# Patient Record
Sex: Male | Born: 1960 | Race: White | Hispanic: No | Marital: Married | Smoking: Never smoker
Health system: Southern US, Community
[De-identification: ages and names within clinical notes are randomized; demographics above are authoritative.]

## PROBLEM LIST (undated history)

## (undated) DIAGNOSIS — L570 Actinic keratosis: Secondary | ICD-10-CM

## (undated) HISTORY — PX: SKIN GRAFT: SHX250

## (undated) HISTORY — PX: NECK SURGERY: SHX720

## (undated) HISTORY — DX: Actinic keratosis: L57.0

---

## 2012-01-14 ENCOUNTER — Ambulatory Visit: Payer: Self-pay | Admitting: Unknown Physician Specialty

## 2013-06-23 ENCOUNTER — Ambulatory Visit: Payer: Self-pay | Admitting: Unknown Physician Specialty

## 2014-06-09 ENCOUNTER — Emergency Department: Payer: Self-pay | Admitting: Emergency Medicine

## 2015-04-03 ENCOUNTER — Emergency Department
Admission: EM | Admit: 2015-04-03 | Discharge: 2015-04-03 | Disposition: A | Discharge status: Home / Self Care | Payer: PRIVATE HEALTH INSURANCE | Attending: Emergency Medicine | Admitting: Emergency Medicine

## 2015-04-03 ENCOUNTER — Encounter: Payer: Self-pay | Admitting: Emergency Medicine

## 2015-04-03 ENCOUNTER — Emergency Department: Payer: PRIVATE HEALTH INSURANCE

## 2015-04-03 DIAGNOSIS — Y92410 Unspecified street and highway as the place of occurrence of the external cause: Secondary | ICD-10-CM | POA: Insufficient documentation

## 2015-04-03 DIAGNOSIS — Y998 Other external cause status: Secondary | ICD-10-CM | POA: Diagnosis not present

## 2015-04-03 DIAGNOSIS — Y9389 Activity, other specified: Secondary | ICD-10-CM | POA: Diagnosis not present

## 2015-04-03 DIAGNOSIS — S161XXA Strain of muscle, fascia and tendon at neck level, initial encounter: Secondary | ICD-10-CM | POA: Diagnosis not present

## 2015-04-03 DIAGNOSIS — S199XXA Unspecified injury of neck, initial encounter: Secondary | ICD-10-CM | POA: Diagnosis present

## 2015-04-03 MED ORDER — DIAZEPAM 2 MG PO TABS: 2.0000 mg | ORAL_TABLET | Freq: Three times a day (TID) | ORAL | Status: AC | PRN

## 2015-04-03 MED ORDER — HYDROCODONE-ACETAMINOPHEN 5-325 MG PO TABS: 1.0000 | ORAL_TABLET | ORAL | Status: AC | PRN

## 2015-04-03 MED ORDER — NAPROXEN 500 MG PO TABS: 500.0000 mg | ORAL_TABLET | Freq: Two times a day (BID) | ORAL | Status: AC
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
## 2015-04-03 NOTE — Discharge Instructions (Signed)
Cervical Sprain A cervical sprain is when the tissues (ligaments) that hold the neck bones in place stretch or tear. HOME CARE   Put ice on the injured area.  Put ice in a plastic bag.  Place a towel between your skin and the bag.  Leave the ice on for 15-20 minutes, 3-4 times a day.  You may have been given a collar to wear. This collar keeps your neck from moving while you heal.  Do not take the collar off unless told by your doctor.  If you have long hair, keep it outside of the collar.  Ask your doctor before changing the position of your collar. You may need to change its position over time to make it more comfortable.  If you are allowed to take off the collar for cleaning or bathing, follow your doctor's instructions on how to do it safely.  Keep your collar clean by wiping it with mild soap and water. Dry it completely. If the collar has removable pads, remove them every 1-2 days to hand wash them with soap and water. Allow them to air dry. They should be dry before you wear them in the collar.  Do not drive while wearing the collar.  Only take medicine as told by your doctor.  Keep all doctor visits as told.  Keep all physical therapy visits as told.  Adjust your work station so that you have good posture while you work.  Avoid positions and activities that make your problems worse.  Warm up and stretch before being active. GET HELP IF:  Your pain is not controlled with medicine.  You cannot take less pain medicine over time as planned.  Your activity level does not improve as expected. GET HELP RIGHT AWAY IF:   You are bleeding.  Your stomach is upset.  You have an allergic reaction to your medicine.  You develop new problems that you cannot explain.  You lose feeling (become numb) or you cannot move any part of your body (paralysis).  You have tingling or weakness in any part of your body.  Your symptoms get worse. Symptoms include:  Pain,  soreness, stiffness, puffiness (swelling), or a burning feeling in your neck.  Pain when your neck is touched.  Shoulder or upper back pain.  Limited ability to move your neck.  Headache.  Dizziness.  Your hands or arms feel week, lose feeling, or tingle.  Muscle spasms.  Difficulty swallowing or chewing. MAKE SURE YOU:   Understand these instructions.  Will watch your condition.  Will get help right away if you are not doing well or get worse.   This information is not intended to replace advice given to you by your health care provider. Make sure you discuss any questions you have with your health care provider.   Document Released: 10/07/2007 Document Revised: 12/21/2012 Document Reviewed: 10/26/2012 Elsevier Interactive Patient Education 2016 [place] with your doctor if any continued problems with your neck. Moist heat or ice to muscle areas as needed for comfort. Naproxen 500 mg twice a day with food as needed for inflammation, diazepam 2 mg every 8 hours as needed for muscle spasms, and Norco as needed for severe pain if needed. Return to the emergency room if any severe worsening of her symptoms.
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
## 2017-11-26 ENCOUNTER — Other Ambulatory Visit
Admission: RE | Admit: 2017-11-26 | Discharge: 2017-11-26 | Disposition: A | Discharge status: Home / Self Care | Payer: 59 | Source: Ambulatory Visit | Attending: Family | Admitting: Family

## 2017-11-26 DIAGNOSIS — E0500 Thyrotoxicosis with diffuse goiter without thyrotoxic crisis or storm: Secondary | ICD-10-CM | POA: Diagnosis not present

## 2017-11-26 LAB — HEPATIC FUNCTION PANEL
ALK PHOS: 77 U/L (ref 38–126)
ALT: 93 U/L — ABNORMAL HIGH (ref 0–44)
AST: 42 U/L — ABNORMAL HIGH (ref 15–41)
Albumin: 4.1 g/dL (ref 3.5–5.0)
BILIRUBIN INDIRECT: 1.2 mg/dL — AB (ref 0.3–0.9)
BILIRUBIN TOTAL: 1.3 mg/dL — AB (ref 0.3–1.2)
Bilirubin, Direct: 0.1 mg/dL (ref 0.0–0.2)
Total Protein: 7 g/dL (ref 6.5–8.1)

## 2017-11-26 LAB — CBC
HEMATOCRIT: 44 % (ref 40.0–52.0)
HEMOGLOBIN: 15.4 g/dL (ref 13.0–18.0)
MCH: 30.9 pg (ref 26.0–34.0)
MCHC: 35.1 g/dL (ref 32.0–36.0)
MCV: 88.2 fL (ref 80.0–100.0)
Platelets: 226 10*3/uL (ref 150–440)
RBC: 4.99 MIL/uL (ref 4.40–5.90)
RDW: 13.9 % (ref 11.5–14.5)
WBC: 5 10*3/uL (ref 3.8–10.6)

## 2017-11-26 LAB — T4, FREE: Free T4: 1.02 ng/dL (ref 0.82–1.77)

## 2017-11-28 LAB — THYROID PANEL WITH TSH
FREE THYROXINE INDEX: 2.3 (ref 1.2–4.9)
T3 UPTAKE RATIO: 29 % (ref 24–39)
T4, Total: 7.8 ug/dL (ref 4.5–12.0)
TSH: <0.006 u[IU]/mL — ABNORMAL LOW (ref 0.450–4.500)

## 2017-11-28 LAB — HEPATITIS PANEL, ACUTE
HEP B C IGM: NEGATIVE
Hep A IgM: NEGATIVE
Hepatitis B Surface Ag: NEGATIVE

## 2017-11-28 LAB — T3, FREE: T3, Free: 3.6 pg/mL (ref 2.0–4.4)

## 2018-11-14 ENCOUNTER — Other Ambulatory Visit: Admission: RE | Admit: 2018-11-14 | Payer: 59 | Source: Ambulatory Visit

## 2018-11-17 ENCOUNTER — Ambulatory Visit: Admit: 2018-11-17 | Payer: 59 | Admitting: Podiatry

## 2018-11-17 SURGERY — BONE EXCISION
Anesthesia: Choice | Laterality: Left

## 2019-03-20 ENCOUNTER — Other Ambulatory Visit
Admission: RE | Admit: 2019-03-20 | Discharge: 2019-03-20 | Disposition: A | Discharge status: Home / Self Care | Payer: 59 | Source: Ambulatory Visit | Attending: Family | Admitting: Family

## 2019-03-20 DIAGNOSIS — E0500 Thyrotoxicosis with diffuse goiter without thyrotoxic crisis or storm: Secondary | ICD-10-CM | POA: Diagnosis not present

## 2019-03-20 LAB — CBC
HCT: 40.6 % (ref 39.0–52.0)
Hemoglobin: 14.2 g/dL (ref 13.0–17.0)
MCH: 31.6 pg (ref 26.0–34.0)
MCHC: 35 g/dL (ref 30.0–36.0)
MCV: 90.2 fL (ref 80.0–100.0)
Platelets: 216 10*3/uL (ref 150–400)
RBC: 4.5 MIL/uL (ref 4.22–5.81)
RDW: 12.4 % (ref 11.5–15.5)
WBC: 5.5 10*3/uL (ref 4.0–10.5)
nRBC: 0 % (ref 0.0–0.2)

## 2019-03-20 LAB — HEPATIC FUNCTION PANEL
ALT: 28 U/L (ref 0–44)
AST: 31 U/L (ref 15–41)
Albumin: 4.5 g/dL (ref 3.5–5.0)
Alkaline Phosphatase: 51 U/L (ref 38–126)
Bilirubin, Direct: 0.2 mg/dL (ref 0.0–0.2)
Indirect Bilirubin: 1.3 mg/dL — ABNORMAL HIGH (ref 0.3–0.9)
Total Bilirubin: 1.5 mg/dL — ABNORMAL HIGH (ref 0.3–1.2)
Total Protein: 7.4 g/dL (ref 6.5–8.1)

## 2019-03-20 LAB — TSH: TSH: 3.966 u[IU]/mL (ref 0.350–4.500)

## 2019-03-20 LAB — T4, FREE: Free T4: 0.78 ng/dL (ref 0.61–1.12)

## 2019-03-21 LAB — THYROTROPIN RECEPTOR AUTOABS: Thyrotropin Receptor Ab: <1.1 IU/L (ref 0.00–1.75)

## 2019-03-21 LAB — T3: T3, Total: 86 ng/dL (ref 71–180)

## 2021-12-08 ENCOUNTER — Ambulatory Visit: Payer: BC Managed Care – PPO | Admitting: Dermatology

## 2021-12-08 DIAGNOSIS — L814 Other melanin hyperpigmentation: Secondary | ICD-10-CM

## 2021-12-08 DIAGNOSIS — L821 Other seborrheic keratosis: Secondary | ICD-10-CM

## 2021-12-08 DIAGNOSIS — L578 Other skin changes due to chronic exposure to nonionizing radiation: Secondary | ICD-10-CM

## 2021-12-08 DIAGNOSIS — L738 Other specified follicular disorders: Secondary | ICD-10-CM | POA: Diagnosis not present

## 2021-12-08 DIAGNOSIS — Z1283 Encounter for screening for malignant neoplasm of skin: Secondary | ICD-10-CM | POA: Diagnosis not present

## 2021-12-08 DIAGNOSIS — D229 Melanocytic nevi, unspecified: Secondary | ICD-10-CM | POA: Diagnosis not present

## 2021-12-08 DIAGNOSIS — I8393 Asymptomatic varicose veins of bilateral lower extremities: Secondary | ICD-10-CM

## 2021-12-08 DIAGNOSIS — I781 Nevus, non-neoplastic: Secondary | ICD-10-CM

## 2021-12-08 DIAGNOSIS — D1800 Hemangioma unspecified site: Secondary | ICD-10-CM

## 2021-12-08 DIAGNOSIS — L570 Actinic keratosis: Secondary | ICD-10-CM | POA: Diagnosis not present

## 2021-12-08 DIAGNOSIS — L820 Inflamed seborrheic keratosis: Secondary | ICD-10-CM

## 2021-12-08 NOTE — Progress Notes (Unsigned)
New Patient Visit  Subjective  Marcus Randolph is a 61 y.o. male who presents for the following: Annual Exam (No history of skin cancer or abnormal moles - The patient presents for Total-Body Skin Exam (TBSE) for skin cancer screening and mole check.  The patient has spots, moles and lesions to be evaluated, some may be new or changing and the patient has concerns that these could be cancer./).  The following portions of the chart were reviewed this encounter and updated as appropriate:   Tobacco  Allergies  Meds  Problems  Med Hx  Surg Hx  Fam Hx     Review of Systems:  No other skin or systemic complaints except as noted in HPI or Assessment and Plan.  Objective  Well appearing patient in no apparent distress; mood and affect are within normal limits.  A full examination was performed including scalp, head, eyes, ears, nose, lips, neck, chest, axillae, abdomen, back, buttocks, bilateral upper extremities, bilateral lower extremities, hands, feet, fingers, toes, fingernails, and toenails. All findings within normal limits unless otherwise noted below.  Face (3) Erythematous thin papules/macules with gritty scale.   Face Yellow papules  Face Dilated blood vessels  Scalp (5) Erythematous stuck-on, waxy papule or plaque  Legs Spider veins   Assessment & Plan   Lentigines - Scattered tan macules - Due to sun exposure - Benign-appearing, observe - Recommend daily broad spectrum sunscreen SPF 30+ to sun-exposed areas, reapply every 2 hours as needed. - Call for any changes  Seborrheic Keratoses - Stuck-on, waxy, tan-brown papules and/or plaques  - Benign-appearing - Discussed benign etiology and prognosis. - Observe - Call for any changes  Melanocytic Nevi - Tan-brown and/or pink-flesh-colored symmetric macules and papules - Benign appearing on exam today - Observation - Call clinic for new or changing moles - Recommend daily use of broad spectrum spf 30+  sunscreen to sun-exposed areas.   Hemangiomas - Red papules - Discussed benign nature - Observe - Call for any changes  Actinic Damage - Chronic condition, secondary to cumulative UV/sun exposure - diffuse scaly erythematous macules with underlying dyspigmentation - Recommend daily broad spectrum sunscreen SPF 30+ to sun-exposed areas, reapply every 2 hours as needed.  - Staying in the shade or wearing long sleeves, sun glasses (UVA+UVB protection) and wide brim hats (4-inch brim around the entire circumference of the hat) are also recommended for sun protection.  - Call for new or changing lesions.  Skin cancer screening performed today.  AK (actinic keratosis) (3) Face Destruction of lesion - Face Complexity: simple   Destruction method: cryotherapy   Informed consent: discussed and consent obtained   Timeout:  patient name, date of birth, surgical site, and procedure verified Lesion destroyed using liquid nitrogen: Yes   Region frozen until ice ball extended beyond lesion: Yes   Outcome: patient tolerated procedure well with no complications   Post-procedure details: wound care instructions given    Sebaceous hyperplasia Face Benign, observe.   Telangiectasia Face Benign, observe.  Discussed the treatment option of BBL/laser.  Typically we recommend 1-3 treatment sessions about 5-8 weeks apart for best results.  The patient's condition may require "maintenance treatments" in the future.  The fee for BBL / laser treatments is $350 per treatment session for the whole face.  A fee can be quoted for other parts of the body. Insurance typically does not pay for BBL/laser treatments and therefore the fee is an out-of-pocket cost.  Inflamed seborrheic keratosis (5) Scalp  Destruction of lesion - Scalp Complexity: simple   Destruction method: cryotherapy   Informed consent: discussed and consent obtained   Timeout:  patient name, date of birth, surgical site, and procedure  verified Lesion destroyed using liquid nitrogen: Yes   Region frozen until ice ball extended beyond lesion: Yes   Outcome: patient tolerated procedure well with no complications   Post-procedure details: wound care instructions given    Return in about 1 year (around 12/09/2022) for TBSE.  I, Ashok Cordia, CMA, am acting as scribe for Sarina Ser, MD . Documentation: I have reviewed the above documentation for accuracy and completeness, and I agree with the above.  Sarina Ser, MD

## 2021-12-08 NOTE — Patient Instructions (Signed)
Cryotherapy Aftercare  Wash gently with soap and water everyday.   Apply Vaseline and Band-Aid daily until healed.     Due to recent changes in healthcare laws, you may see results of your pathology and/or laboratory studies on MyChart before the doctors have had a chance to review them. We understand that in some cases there may be results that are confusing or concerning to you. Please understand that not all results are received at the same time and often the doctors may need to interpret multiple results in order to provide you with the best plan of care or course of treatment. Therefore, we ask that you please give us 2 business days to thoroughly review all your results before contacting the office for clarification. Should we see a critical lab result, you will be contacted sooner.   If You Need Anything After Your Visit  If you have any questions or concerns for your doctor, please call our main line at [phone] and press option 4 to reach your doctor's medical assistant. If no one answers, please leave a voicemail as directed and we will return your call as soon as possible. Messages left after 4 pm will be answered the following business day.   You may also send us a message via MyChart. We typically respond to MyChart messages within 1-2 business days.  For prescription refills, please ask your pharmacy to contact our office. Our fax number is [phone].  If you have an urgent issue when the clinic is closed that cannot wait until the next business day, you can page your doctor at the number below.    Please note that while we do our best to be available for urgent issues outside of office hours, we are not available 24/7.   If you have an urgent issue and are unable to reach us, you may choose to seek medical care at your doctor's office, retail clinic, urgent care center, or emergency room.  If you have a medical emergency, please immediately call 911 or go to the  emergency department.  Pager Numbers  - Dr. Kowalski: [phone]  - Dr. Moye: [phone]  - Dr. Stewart: [phone]  In the event of inclement weather, please call our main line at [phone] for an update on the status of any delays or closures.  Dermatology Medication Tips: Please keep the boxes that topical medications come in in order to help keep track of the instructions about where and how to use these. Pharmacies typically print the medication instructions only on the boxes and not directly on the medication tubes.   If your medication is too expensive, please contact our office at [phone] option 4 or send us a message through MyChart.   We are unable to tell what your co-pay for medications will be in advance as this is different depending on your insurance coverage. However, we may be able to find a substitute medication at lower cost or fill out paperwork to get insurance to cover a needed medication.   If a prior authorization is required to get your medication covered by your insurance company, please allow us 1-2 business days to complete this process.  Drug prices often vary depending on where the prescription is filled and some pharmacies may offer cheaper prices.  The website www.goodrx.com contains coupons for medications through different pharmacies. The prices here do not account for what the cost may be with help from insurance (it may be cheaper with your insurance), but the website can   give you the price if you did not use any insurance.  - You can print the associated coupon and take it with your prescription to the pharmacy.  - You may also stop by our office during regular business hours and pick up a GoodRx coupon card.  - If you need your prescription sent electronically to a different pharmacy, notify our office through [facility] MyChart or by phone at [phone] option 4.     Si Usted Necesita Algo Despus de Su Visita  Tambin puede  enviarnos un mensaje a travs de MyChart. Por lo general respondemos a los mensajes de MyChart en el transcurso de 1 a 2 das hbiles.  Para renovar recetas, por favor pida a su farmacia que se ponga en contacto con nuestra oficina. Nuestro nmero de fax es el [phone].  Si tiene un asunto urgente cuando la clnica est cerrada y que no puede esperar hasta el siguiente da hbil, puede llamar/localizar a su doctor(a) al nmero que aparece a continuacin.   Por favor, tenga en cuenta que aunque hacemos todo lo posible para estar disponibles para asuntos urgentes fuera del horario de oficina, no estamos disponibles las 24 horas del da, los 7 das de la semana.   Si tiene un problema urgente y no puede comunicarse con nosotros, puede optar por buscar atencin mdica  en el consultorio de su doctor(a), en una clnica privada, en un centro de atencin urgente o en una sala de emergencias.  Si tiene una emergencia mdica, por favor llame inmediatamente al 911 o vaya a la sala de emergencias.  Nmeros de bper  - Dr. Kowalski: [phone]  - Dra. Moye: [phone]  - Dra. Stewart: [phone]  En caso de inclemencias del tiempo, por favor llame a nuestra lnea principal al [phone] para una actualizacin sobre el estado de cualquier retraso o cierre.  Consejos para la medicacin en dermatologa: Por favor, guarde las cajas en las que vienen los medicamentos de uso tpico para ayudarle a seguir las instrucciones sobre dnde y cmo usarlos. Las farmacias generalmente imprimen las instrucciones del medicamento slo en las cajas y no directamente en los tubos del medicamento.   Si su medicamento es muy caro, por favor, pngase en contacto con nuestra oficina llamando al [phone] y presione la opcin 4 o envenos un mensaje a travs de MyChart.   No podemos decirle cul ser su copago por los medicamentos por adelantado ya que esto es diferente dependiendo de la cobertura de su seguro.  Sin embargo, es posible que podamos encontrar un medicamento sustituto a menor costo o llenar un formulario para que el seguro cubra el medicamento que se considera necesario.   Si se requiere una autorizacin previa para que su compaa de seguros cubra su medicamento, por favor permtanos de 1 a 2 das hbiles para completar este proceso.  Los precios de los medicamentos varan con frecuencia dependiendo del lugar de dnde se surte la receta y alguna farmacias pueden ofrecer precios ms baratos.  El sitio web www.goodrx.com tiene cupones para medicamentos de diferentes farmacias. Los precios aqu no tienen en cuenta lo que podra costar con la ayuda del seguro (puede ser ms barato con su seguro), pero el sitio web puede darle el precio si no utiliz ningn seguro.  - Puede imprimir el cupn correspondiente y llevarlo con su receta a la farmacia.  - Tambin puede pasar por nuestra oficina durante el horario de atencin regular y recoger una tarjeta de cupones de GoodRx.  -   Si necesita que su receta se enve electrnicamente a una farmacia diferente, informe a nuestra oficina a travs de MyChart de [facility] o por telfono llamando al [phone] y presione la opcin 4.  

## 2021-12-09 ENCOUNTER — Encounter: Payer: Self-pay | Admitting: Dermatology

## 2023-01-13 ENCOUNTER — Ambulatory Visit: Payer: BC Managed Care – PPO | Admitting: Dermatology

## 2023-01-13 ENCOUNTER — Encounter: Payer: Self-pay | Admitting: Dermatology

## 2023-01-13 VITALS — BP 140/82

## 2023-01-13 DIAGNOSIS — Z1283 Encounter for screening for malignant neoplasm of skin: Secondary | ICD-10-CM | POA: Diagnosis not present

## 2023-01-13 DIAGNOSIS — L738 Other specified follicular disorders: Secondary | ICD-10-CM

## 2023-01-13 DIAGNOSIS — T148XXA Other injury of unspecified body region, initial encounter: Secondary | ICD-10-CM

## 2023-01-13 DIAGNOSIS — L814 Other melanin hyperpigmentation: Secondary | ICD-10-CM

## 2023-01-13 DIAGNOSIS — L570 Actinic keratosis: Secondary | ICD-10-CM | POA: Diagnosis not present

## 2023-01-13 DIAGNOSIS — Z7189 Other specified counseling: Secondary | ICD-10-CM

## 2023-01-13 DIAGNOSIS — L810 Postinflammatory hyperpigmentation: Secondary | ICD-10-CM

## 2023-01-13 DIAGNOSIS — S8011XA Contusion of right lower leg, initial encounter: Secondary | ICD-10-CM

## 2023-01-13 DIAGNOSIS — Z872 Personal history of diseases of the skin and subcutaneous tissue: Secondary | ICD-10-CM

## 2023-01-13 DIAGNOSIS — W908XXA Exposure to other nonionizing radiation, initial encounter: Secondary | ICD-10-CM

## 2023-01-13 DIAGNOSIS — Z5111 Encounter for antineoplastic chemotherapy: Secondary | ICD-10-CM

## 2023-01-13 DIAGNOSIS — D1801 Hemangioma of skin and subcutaneous tissue: Secondary | ICD-10-CM

## 2023-01-13 DIAGNOSIS — D229 Melanocytic nevi, unspecified: Secondary | ICD-10-CM

## 2023-01-13 DIAGNOSIS — L578 Other skin changes due to chronic exposure to nonionizing radiation: Secondary | ICD-10-CM | POA: Diagnosis not present

## 2023-01-13 DIAGNOSIS — D692 Other nonthrombocytopenic purpura: Secondary | ICD-10-CM

## 2023-01-13 DIAGNOSIS — Z79899 Other long term (current) drug therapy: Secondary | ICD-10-CM

## 2023-01-13 DIAGNOSIS — L821 Other seborrheic keratosis: Secondary | ICD-10-CM

## 2023-01-13 DIAGNOSIS — I781 Nevus, non-neoplastic: Secondary | ICD-10-CM

## 2023-01-13 MED ORDER — FLUOROURACIL 5 % EX CREA: TOPICAL_CREAM | Freq: Two times a day (BID) | CUTANEOUS | 2 refills | Status: AC
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
## 2023-01-13 NOTE — Patient Instructions (Addendum)
- In October of 2024 Start 5-fluorouracil/calcipotriene cream twice a day for 7 days to affected areas including scalp (top of forehead to hairline). Prescription sent to [facility]. Patient advised they will receive an email to purchase the medication online and have it sent to their home. Patient provided with handout reviewing treatment course and side effects and advised to call or message [place] on MyChart with any concerns.   Instructions for [facility]  One or more of your medications was sent to the [facility] mail order [facility] pharmacy. You will receive an email from them and can purchase the medicine through that link. It will then be mailed to your home at the address you confirmed. If for any reason you do not receive an email from them, please check your spam folder. If you still do not find the email, please let us know. [facility] phone number is [phone].   5-Fluorouracil/Calcipotriene Patient Education   Actinic keratoses are the dry, red scaly spots on the skin caused by sun damage. A portion of these spots can turn into skin cancer with time, and treating them can help prevent development of skin cancer.   Treatment of these spots requires removal of the defective skin cells. There are various ways to remove actinic keratoses, including freezing with liquid nitrogen, treatment with creams, or treatment with a blue light procedure in the office.   5-fluorouracil cream is a topical cream used to treat actinic keratoses. It works by interfering with the growth of abnormal fast-growing skin cells, such as actinic keratoses. These cells peel off and are replaced by healthy ones.   5-fluorouracil/calcipotriene is a combination of the 5-fluorouracil cream with a vitamin D analog cream called calcipotriene. The calcipotriene alone does not treat actinic keratoses. However, when it is combined with 5-fluorouracil, it helps the  5-fluorouracil treat the actinic keratoses much faster so that the same results can be achieved with a much shorter treatment time.  INSTRUCTIONS FOR 5-FLUOROURACIL/CALCIPOTRIENE CREAM:   5-fluorouracil/calcipotriene cream typically only needs to be used for 4-7 days. A thin layer should be applied twice a day to the treatment areas recommended by your physician.   If your physician prescribed you separate tubes of 5-fluourouracil and calcipotriene, apply a thin layer of 5-fluorouracil followed by a thin layer of calcipotriene.   Avoid contact with your eyes, nostrils, and mouth. Do not use 5-fluorouracil/calcipotriene cream on infected or open wounds.   You will develop redness, irritation and some crusting at areas where you have pre-cancer damage/actinic keratoses. IF YOU DEVELOP PAIN, BLEEDING, OR SIGNIFICANT CRUSTING, STOP THE TREATMENT EARLY - you have already gotten a good response and the actinic keratoses should clear up well.  Wash your hands after applying 5-fluorouracil 5% cream on your skin.   A moisturizer or sunscreen with a minimum SPF 30 should be applied each morning.   Once you have finished the treatment, you can apply a thin layer of Vaseline twice a day to irritated areas to soothe and calm the areas more quickly. If you experience significant discomfort, contact your physician.  For some patients it is necessary to repeat the treatment for best results.  SIDE EFFECTS: When using 5-fluorouracil/calcipotriene cream, you may have mild irritation, such as redness, dryness, swelling, or a mild burning sensation. This usually resolves within 2 weeks. The more actinic keratoses you have, the more redness and inflammation you can expect during treatment. Eye irritation has been reported rarely. If this occurs, please  let us know.  If you have any trouble using this cream, please call the office. If you have any other questions about this information, please do not hesitate to ask  me before you leave the office.   Cryotherapy Aftercare  Wash gently with soap and water everyday.   Apply Vaseline and Band-Aid daily until healed.    Due to recent changes in healthcare laws, you may see results of your pathology and/or laboratory studies on MyChart before the doctors have had a chance to review them. We understand that in some cases there may be results that are confusing or concerning to you. Please understand that not all results are received at the same time and often the doctors may need to interpret multiple results in order to provide you with the best plan of care or course of treatment. Therefore, we ask that you please give [place] 2 business days to thoroughly review all your results before contacting the office for clarification. Should we see a critical lab result, you will be contacted sooner.   If You Need Anything After Your Visit  If you have any questions or concerns for your doctor, please call our main line at [phone] and press option 4 to reach your doctor's medical assistant. If no one answers, please leave a voicemail as directed and we will return your call as soon as possible. Messages left after 4 pm will be answered the following business day.   You may also send [place] a message via MyChart. We typically respond to MyChart messages within 1-2 business days.  For prescription refills, please ask your pharmacy to contact our office. Our fax number is [phone].  If you have an urgent issue when the clinic is closed that cannot wait until the next business day, you can page your doctor at the number below.    Please note that while we do our best to be available for urgent issues outside of office hours, we are not available 24/7.   If you have an urgent issue and are unable to reach [place], you may choose to seek medical care at your doctor's office, retail clinic, urgent care center, or emergency room.  If you have a medical emergency, please  immediately call 911 or go to the emergency department.  Pager Numbers  - Dr. Gwen Pounds: [phone]  - Dr. Roseanne Reno: [phone]  - Dr. Katrinka Blazing: [phone]   In the event of inclement weather, please call our main line at [phone] for an update on the status of any delays or closures.  Dermatology Medication Tips: Please keep the boxes that topical medications come in in order to help keep track of the instructions about where and how to use these. Pharmacies typically print the medication instructions only on the boxes and not directly on the medication tubes.   If your medication is too expensive, please contact our office at [phone] option 4 or send [place] a message through MyChart.   We are unable to tell what your co-pay for medications will be in advance as this is different depending on your insurance coverage. However, we may be able to find a substitute medication at lower cost or fill out paperwork to get insurance to cover a needed medication.   If a prior authorization is required to get your medication covered by your insurance company, please allow [place] 1-2 business days to complete this process.  Drug prices often vary depending on where the prescription is filled and some pharmacies may offer  cheaper prices.  The website www.goodrx.com contains coupons for medications through different pharmacies. The prices here do not account for what the cost may be with help from insurance (it may be cheaper with your insurance), but the website can give you the price if you did not use any insurance.  - You can print the associated coupon and take it with your prescription to the pharmacy.  - You may also stop by our office during regular business hours and pick up a GoodRx coupon card.  - If you need your prescription sent electronically to a different pharmacy, notify our office through [facility] or by phone at [phone] option 4.     Si Usted Necesita Algo  Despus de Su Visita  Tambin puede enviarnos un mensaje a travs de Clinical cytogeneticist. Por lo general respondemos a los mensajes de MyChart en el transcurso de 1 a 2 das hbiles.  Para renovar recetas, por favor pida a su farmacia que se ponga en contacto con nuestra oficina. Annie Sable de fax es [place] [phone].  Si tiene un asunto urgente cuando la clnica est cerrada y que no puede esperar hasta el siguiente da hbil, puede llamar/localizar a su doctor(a) al nmero que aparece a continuacin.   Por favor, tenga en cuenta que aunque hacemos todo lo posible para estar disponibles para asuntos urgentes fuera del horario de [place], no estamos disponibles las 24 horas del da, los 7 [address]  [address] de la [place].   Si tiene un problema urgente y no puede comunicarse con nosotros, puede optar por buscar atencin mdica  en el consultorio de su doctor(a), en una clnica privada, en un centro de atencin urgente o en una sala de emergencias.  Si tiene Engineer, drilling, por favor llame inmediatamente al 911 o vaya a la sala de emergencias.  Nmeros de bper  - Dr. Gwen Pounds: [phone]  - Dra. Roseanne Reno: [phone]  - Dr. Katrinka Blazing: [phone]   En caso de inclemencias del tiempo, por favor llame a Lacy Duverney principal al [phone] para una actualizacin sobre el [place] de cualquier retraso o cierre.  Consejos para la medicacin en dermatologa: Por favor, guarde las cajas en las que vienen los medicamentos de uso tpico para ayudarle a seguir las instrucciones sobre dnde y cmo usarlos. Las farmacias generalmente imprimen las instrucciones del medicamento slo en las cajas y no directamente en los tubos del [place].   Si su medicamento es muy caro, por favor, pngase en contacto con Rolm Gala llamando al [phone] y presione la opcin 4 o envenos un mensaje a travs de Clinical cytogeneticist.   No podemos decirle cul ser su copago por los medicamentos por adelantado ya que esto es diferente  dependiendo de la cobertura de su seguro. Sin embargo, es posible que podamos encontrar un medicamento sustituto a Audiological scientist un formulario para que el seguro cubra el medicamento que se considera necesario.   Si se requiere una autorizacin previa para que su compaa de seguros [place] su medicamento, por favor permtanos de 1 a 2 das hbiles para completar [address]  Los precios de los medicamentos varan con frecuencia dependiendo del Environmental consultant de dnde se surte la receta y alguna farmacias pueden ofrecer precios ms baratos.  El sitio web www.goodrx.com tiene cupones para medicamentos de Health and safety inspector. Los precios aqu no tienen en cuenta lo que podra costar con la ayuda del seguro (puede ser ms barato con su seguro), pero el sitio web puede darle el precio si no Dance movement psychotherapist  ningn seguro.  - Puede imprimir el cupn correspondiente y llevarlo con su receta a la farmacia.  - Tambin puede pasar por nuestra oficina durante el horario de atencin regular y Education officer, museum una tarjeta de cupones de GoodRx.  - Si necesita que su receta se enve electrnicamente a una farmacia diferente, informe a nuestra oficina a travs de MyChart de [place] o por telfono llamando al [phone] y presione la opcin 4.

## 2023-01-15 ENCOUNTER — Encounter: Payer: Self-pay | Admitting: Dermatology

## 2023-10-21 ENCOUNTER — Other Ambulatory Visit: Payer: Self-pay

## 2023-10-21 ENCOUNTER — Emergency Department

## 2023-10-21 ENCOUNTER — Emergency Department: Admission: EM | Admit: 2023-10-21 | Discharge: 2023-10-21 | Disposition: A | Discharge status: Home / Self Care

## 2023-10-21 DIAGNOSIS — S2231XA Fracture of one rib, right side, initial encounter for closed fracture: Secondary | ICD-10-CM

## 2023-10-21 DIAGNOSIS — R0781 Pleurodynia: Secondary | ICD-10-CM | POA: Diagnosis present

## 2023-10-21 MED ORDER — IBUPROFEN 800 MG PO TABS: 800.0000 mg | ORAL_TABLET | Freq: Three times a day (TID) | ORAL | 0 refills | Status: AC | PRN

## 2023-10-21 MED ORDER — KETOROLAC TROMETHAMINE 30 MG/ML IJ SOLN
30.0000 mg | Freq: Once | INTRAMUSCULAR | Status: AC
  Administered 2023-10-21: 30 mg via INTRAMUSCULAR
  Filled 2023-10-21: qty 1

## 2023-10-21 MED ORDER — ACETAMINOPHEN 500 MG PO TABS
1000.0000 mg | ORAL_TABLET | Freq: Once | ORAL | Status: AC
  Administered 2023-10-21: 1000 mg via ORAL
  Filled 2023-10-21: qty 2
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
## 2023-10-21 NOTE — Discharge Instructions (Addendum)
 You were evaluated in the ED for right rib pain.  Your x-ray reveals an acute fracture to the eighth rib.  Most rib fractures heal on their own with time and supportive care.  Take Tylenol  and ibuprofen as needed for pain.  Use incentive spirometer twice daily to prevent pneumonia.  Rest as needed, but try to walk and move around to avoid complications.  Avoid heavy lifting or strenuous activity until pain improves.  Apply ice to the affected area for 20 minutes on 20 minutes off to reduce pain and swelling for the first few days.  If any symptoms of increased pain or shortness of breath is experienced please return to ED for further evaluation.  An orthopedist has been attached to your discharge papers if symptoms persist for follow-up.

## 2023-10-21 NOTE — ED Notes (Signed)
 Patient provided incentive spirometer and education provided.

## 2024-01-19 ENCOUNTER — Ambulatory Visit: Payer: BC Managed Care – PPO | Admitting: Dermatology
